# Patient Record
Sex: Male | Born: 2000 | Race: White | Hispanic: No | Marital: Single | Smoking: Never smoker
Health system: Southern US, Community
[De-identification: ages and names within clinical notes are randomized; demographics above are authoritative.]

---
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
## 2020-01-14 DIAGNOSIS — G932 Benign intracranial hypertension: Secondary | ICD-10-CM | POA: Diagnosis not present

## 2020-02-10 DIAGNOSIS — Z0000 Encounter for general adult medical examination without abnormal findings: Secondary | ICD-10-CM | POA: Diagnosis not present

## 2020-02-10 DIAGNOSIS — Z23 Encounter for immunization: Secondary | ICD-10-CM | POA: Diagnosis not present

## 2020-02-10 DIAGNOSIS — E559 Vitamin D deficiency, unspecified: Secondary | ICD-10-CM | POA: Diagnosis not present

## 2020-04-18 DIAGNOSIS — E559 Vitamin D deficiency, unspecified: Secondary | ICD-10-CM | POA: Diagnosis not present

## 2020-06-17 DIAGNOSIS — E559 Vitamin D deficiency, unspecified: Secondary | ICD-10-CM | POA: Diagnosis not present

## 2020-06-20 DIAGNOSIS — Z72820 Sleep deprivation: Secondary | ICD-10-CM | POA: Diagnosis not present

## 2020-06-20 DIAGNOSIS — G932 Benign intracranial hypertension: Secondary | ICD-10-CM | POA: Diagnosis not present

## 2020-06-20 DIAGNOSIS — R41840 Attention and concentration deficit: Secondary | ICD-10-CM | POA: Diagnosis not present

## 2020-06-29 DIAGNOSIS — R519 Headache, unspecified: Secondary | ICD-10-CM | POA: Diagnosis not present

## 2020-06-29 DIAGNOSIS — J029 Acute pharyngitis, unspecified: Secondary | ICD-10-CM | POA: Diagnosis not present

## 2020-06-29 DIAGNOSIS — R0981 Nasal congestion: Secondary | ICD-10-CM | POA: Diagnosis not present

## 2020-06-29 DIAGNOSIS — Z03818 Encounter for observation for suspected exposure to other biological agents ruled out: Secondary | ICD-10-CM | POA: Diagnosis not present

## 2020-08-31 DIAGNOSIS — E559 Vitamin D deficiency, unspecified: Secondary | ICD-10-CM | POA: Diagnosis not present

## 2022-10-12 DIAGNOSIS — G932 Benign intracranial hypertension: Secondary | ICD-10-CM | POA: Diagnosis not present
